# Patient Record
Sex: Female | Born: 1945 | Race: White | Marital: Married | Smoking: Never smoker
Health system: Southern US, Community
[De-identification: ages and names within clinical notes are randomized; demographics above are authoritative.]

## PROBLEM LIST (undated history)

## (undated) DIAGNOSIS — R739 Hyperglycemia, unspecified: Secondary | ICD-10-CM

## (undated) DIAGNOSIS — G2581 Restless legs syndrome: Secondary | ICD-10-CM

## (undated) DIAGNOSIS — F32A Depression, unspecified: Secondary | ICD-10-CM

## (undated) HISTORY — DX: Depression, unspecified: F32.A

## (undated) HISTORY — DX: Hyperglycemia, unspecified: R73.9

## (undated) HISTORY — PX: TONSILLECTOMY: SUR1361

## (undated) HISTORY — PX: BUNIONECTOMY: SHX129

## (undated) HISTORY — PX: CYSTECTOMY: SUR359

## (undated) HISTORY — PX: MASTECTOMY: SHX3

## (undated) HISTORY — DX: Restless legs syndrome: G25.81

---

## 2020-06-24 ENCOUNTER — Encounter: Payer: Self-pay | Admitting: Neurology

## 2020-08-29 NOTE — Progress Notes (Signed)
NEUROLOGY CONSULTATION NOTE  Alicia Macdonald MRN: [id number] DOB: 1946-03-25  Referring provider: Dina Rich, MD Primary care provider: Dina Rich, MD  Reason for consult:  Headache, trigeminal neuralgia  Assessment/Plan:   Left-sided trigeminal neuralgia  1.  Titrate gabapentin to 300mg  TID 2.  Check MRI of brain and left trigeminal nerve 3.  Follow up 6 months.   Subjective:  Alicia Macdonald is a 75 year old female with hypothyroidism, depression, HLD, glaucoma and RLS who presents for headache and left trigeminal neuralgia.  She is accompanied by her husband who supplements history.  About one year ago, she developed left sided ear pain.  She saw her PCP and was told it was not an ear infection.  The pain spread to include severe paroxysmal pain from the left V2 that radiates down the left side of her jaw.  She reports soreness on the left side of her head and if she touches it, it may aggravate the pain in the face.  She also reports soreness on the left side of her throat when she swallows.  No facial numbness or weakness.  She has a known parotid gland nodule that is being monitored by ENT.  She was evaluated by her dentist who provided her a bite guard which has been ineffective.    In November 2021, she slipped and fell striking the left occipital region, sustaining a laceration.  She went to the ED where she received staples.  CT head was negative.  CT cervical spine showed mild DDD with facet arthropathy most pronounced at C5-6.  Current medications:  Gabapentin 300mg  QHS (for RLS), duloxetine 60mg  BID (depression)  PAST MEDICAL HISTORY: Past Medical History:  Diagnosis Date  . Depression   . Hyperglycemia   . RLS (restless legs syndrome)    PAST SURGICAL HISTORY: Past Surgical History:  Procedure Laterality Date  . BUNIONECTOMY    . CESAREAN SECTION  1990  . CYSTECTOMY    . MASTECTOMY    . TONSILLECTOMY     MEDICATIONS: Current Outpatient Medications  on File Prior to Visit  Medication Sig Dispense Refill  . levothyroxine (EUTHYROX) 125 MCG tablet Take 125 mcg by mouth daily.    07-19-1968 atorvastatin (LIPITOR) 40 MG tablet Take 1 tablet by mouth daily.    Sodium (DSS) 100 MG CAPS Take by mouth.    . DULoxetine (CYMBALTA) 60 MG capsule Take 60 mg by mouth every 12 (twelve) hours.    . gabapentin (NEURONTIN) 300 MG capsule Take 1 capsule by mouth at bedtime.    . magnesium (MAGTAB) 84 MG ( ) TBCR SR tablet Take by mouth.    Marland Kitchen omeprazole (PRILOSEC) 40 MG capsule Take 1 capsule by mouth daily.     No current facility-administered medications on file prior to visit.     ALLERGIES: Not on File  FAMILY HISTORY: No family history on file.  Objective:  Blood pressure 135/85, pulse 68, height 5\' 6"  (1.676 m), weight 226 lb 9.6 oz (102.8 kg), SpO2 95 %. General: No acute distress.  Patient appears well-groomed.   Head:  Normocephalic/atraumatic Eyes:  fundi examined but not visualized Neck: supple, no paraspinal tenderness, full range of motion Back: No paraspinal tenderness Heart: regular rate and rhythm Lungs: Clear to auscultation bilaterally. Vascular: No carotid bruits. Neurological Exam: Mental status: alert and oriented to person, place, and time, recent and remote memory intact, fund of knowledge intact, attention and concentration intact, speech fluent and not dysarthric, language intact. Cranial nerves:  CN I: not tested CN II: pupils equal, round and reactive to light, visual fields intact CN III, IV, VI:  full range of motion, no nystagmus, no ptosis CN V: facial sensation intact. CN VII: upper and lower face symmetric CN VIII: hearing intact CN IX, X: gag intact, uvula midline CN XI: sternocleidomastoid and trapezius muscles intact CN XII: tongue midline Bulk & Tone: normal, no fasciculations. Motor:  muscle strength 5/5 throughout Sensation:  Pinprick, temperature and vibratory sensation intact. Deep Tendon  Reflexes:  2+ throughout,  toes downgoing.   Finger to nose testing:  Without dysmetria.   Heel to shin:  Without dysmetria.   Gait:  Normal station and stride.  Romberg negative.    Thank you for allowing me to take part in the care of this patient.  Shon Millet, DO  CC: Dina Rich, MD

## 2020-08-30 ENCOUNTER — Encounter: Payer: Self-pay | Admitting: Neurology

## 2020-08-30 ENCOUNTER — Other Ambulatory Visit: Payer: Self-pay

## 2020-08-30 ENCOUNTER — Ambulatory Visit (INDEPENDENT_AMBULATORY_CARE_PROVIDER_SITE_OTHER): Payer: Medicare Other | Admitting: Neurology

## 2020-08-30 VITALS — BP 135/85 | HR 68 | Ht 66.0 in | Wt 226.6 lb

## 2020-08-30 DIAGNOSIS — G500 Trigeminal neuralgia: Secondary | ICD-10-CM | POA: Diagnosis not present

## 2020-08-30 DIAGNOSIS — R6884 Jaw pain: Secondary | ICD-10-CM

## 2020-08-30 MED ORDER — GABAPENTIN 100 MG PO CAPS: ORAL_CAPSULE | ORAL | 0 refills | Status: AC

## 2020-08-30 NOTE — Patient Instructions (Signed)
1.  Continue gabapentin 300mg  at bedtime 2.  Start gabapentin 100mg  as directed - titrate up to goal of 300mg  three times daily - if no improvement after being on 300mg  three times daily for 2 weeks, then contact me. 3.  MRI of brain and left trigeminal nerve with and without contrast 4.  Follow up 4 to 6 months

## 2020-09-02 ENCOUNTER — Telehealth: Payer: Self-pay | Admitting: Neurology

## 2020-09-02 NOTE — Progress Notes (Signed)
  Patient ID: [id number] Time: Time: 09/02/2020 9:58 AM Patient Name: Alicia Macdonald, Alicia Macdonald Patient DOB: 09-11-1945 Physician Name: Everlena Cooper ADAM Physician Address: [address] [place], [place] [address] Physician Tax YQ:[id number] CPT Code: [phone] Case Number: [id number]  Member is Medicare primary and does not require a notification at this time.

## 2020-09-02 NOTE — Progress Notes (Signed)
  Patient ID: [id number] Time: Time: 09/02/2020 10:03 AM Patient Name: Alicia Macdonald, Alicia Macdonald Patient DOB: Oct 09, 1945 Physician Name: Everlena Cooper ADAM Physician Address: [address] [place], [place] [address] Physician Tax GX:[id number] CPT Code: [phone] Case Number: [id number]  Member is Medicare primary and does not require a notification at this time.

## 2020-09-02 NOTE — Telephone Encounter (Signed)
Patient called and said she forgot to tell Dr. Everlena Cooper about one of her surgeries: bilateral cataract surgery with a stint in each eye.  FYI only

## 2020-09-06 ENCOUNTER — Other Ambulatory Visit: Payer: Medicare Other

## 2020-09-09 ENCOUNTER — Other Ambulatory Visit: Payer: Self-pay

## 2020-09-09 ENCOUNTER — Telehealth: Payer: Self-pay | Admitting: Neurology

## 2020-09-09 DIAGNOSIS — R6884 Jaw pain: Secondary | ICD-10-CM

## 2020-09-09 DIAGNOSIS — G500 Trigeminal neuralgia: Secondary | ICD-10-CM

## 2020-09-09 NOTE — Telephone Encounter (Signed)
Had to reorder mri, await for prior authorization status.

## 2020-09-09 NOTE — Telephone Encounter (Signed)
Patient husband states that the first Berkley Harvey ran out and they called medicare to see if they could get a new auth.  Please call and let the pt know of the status of the auth   Pt phone number is [phone]

## 2020-09-09 NOTE — Telephone Encounter (Signed)
Pt husband states that [facility] imaging states that the MRI needs prior auth and has not heard anything from our office.

## 2020-09-10 ENCOUNTER — Ambulatory Visit
Admission: RE | Admit: 2020-09-10 | Discharge: 2020-09-10 | Disposition: A | Discharge status: Home / Self Care | Payer: Medicare Other | Source: Ambulatory Visit | Attending: Neurology | Admitting: Neurology

## 2020-09-10 DIAGNOSIS — G500 Trigeminal neuralgia: Secondary | ICD-10-CM

## 2020-09-10 DIAGNOSIS — R6884 Jaw pain: Secondary | ICD-10-CM

## 2020-09-10 MED ORDER — GADOBENATE DIMEGLUMINE 529 MG/ML IV SOLN
20.0000 mL | Freq: Once | INTRAVENOUS | Status: AC | PRN
  Administered 2020-09-10: 20 mL via INTRAVENOUS

## 2020-09-13 NOTE — Progress Notes (Signed)
Pt advised.She will call and schedule a visit with ENT.

## 2020-09-13 NOTE — Telephone Encounter (Signed)
Pt schedule a visit with her ENT on 09/23/20 will send a records request to pt so she can sign and send back to have records sent over to the ENT office.

## 2020-09-13 NOTE — Telephone Encounter (Signed)
Patient called and left a message requesting a call back about this. 

## 2020-09-23 ENCOUNTER — Telehealth: Payer: Self-pay | Admitting: Neurology

## 2020-09-23 NOTE — Telephone Encounter (Signed)
Pt called in and needs a rtn call regarding results for her MRI.

## 2020-09-23 NOTE — Telephone Encounter (Signed)
No answer at 1154 09/23/2020

## 2020-09-23 NOTE — Telephone Encounter (Signed)
Patient got what she needed she said and said thank you for calling.

## 2020-10-11 ENCOUNTER — Telehealth: Payer: Self-pay

## 2020-10-11 MED ORDER — GABAPENTIN 300 MG PO CAPS: 1.0000 | ORAL_CAPSULE | Freq: Three times a day (TID) | ORAL | 2 refills | Status: DC

## 2020-10-11 NOTE — Telephone Encounter (Signed)
Refilll request rescived from [facility]: Messge pt needs refills of Gabapentin 100 mg

## 2020-10-11 NOTE — Addendum Note (Signed)
Addended by: Leida Lauth on: 10/11/2020 03:11 PM   Modules accepted: Orders

## 2020-12-20 ENCOUNTER — Other Ambulatory Visit: Payer: Self-pay | Admitting: Hematology and Oncology

## 2020-12-20 ENCOUNTER — Inpatient Hospital Stay: Payer: Medicare Other | Attending: Hematology and Oncology

## 2020-12-20 DIAGNOSIS — R300 Dysuria: Secondary | ICD-10-CM

## 2021-03-03 ENCOUNTER — Ambulatory Visit: Payer: Medicare Other | Admitting: Neurology

## 2021-05-30 ENCOUNTER — Other Ambulatory Visit: Payer: Self-pay | Admitting: Neurology

## 2021-05-31 ENCOUNTER — Other Ambulatory Visit: Payer: Self-pay

## 2021-05-31 MED ORDER — GABAPENTIN 300 MG PO CAPS: 300.0000 mg | ORAL_CAPSULE | Freq: Three times a day (TID) | ORAL | 2 refills | Status: AC

## 2021-09-08 IMAGING — MR MR HEAD WO/W CM
12 series · 48 of 48 positions shown · IV contrast (multihance)
Comparison: Head CT 03 13 20

CLINICAL DATA: Trigeminal neuralgia on the left. History of breast
cancer

EXAM:
MRI HEAD WITHOUT AND WITH CONTRAST
MRI FACE WITH AND WITHOUT CONTRAST
TECHNIQUE: Multiplanar, multiecho pulse sequences of the brain and and face
were obtained without and with intravenous contrast.
CONTRAST:  20mL MULTIHANCE GADOBENATE DIMEGLUMINE 529 MG/ML IV SOLN

[Series 2: T1 · sagittal · 5.0mm · 0.45mm/px · 1 of 25 slices shown]
[im 1/25]
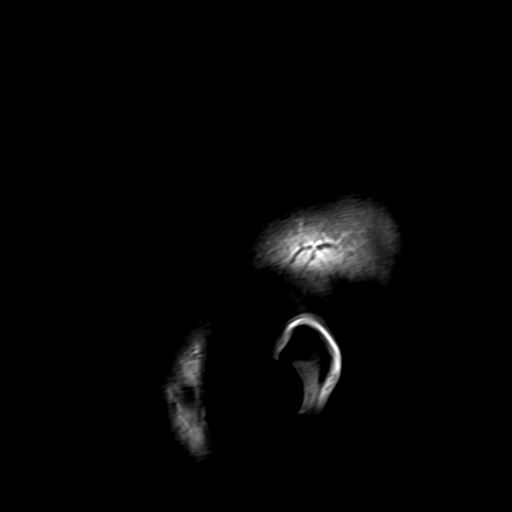

[Series 3: ax ep2d_diff_3 · axial · 3.0mm · 1.80mm/px · z∈[+13,+163]mm · 6 of 104 slices shown]
[im 1/104]
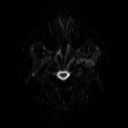
[im 21/104]
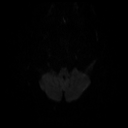
[im 42/104]
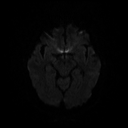
[im 62/104]
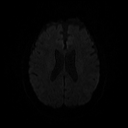
[im 83/104]
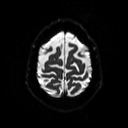
[im 104/104]
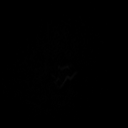

[Series 4: ax ep2d_diff_3_adc · axial · 3.0mm · 1.80mm/px · z∈[+13,+163]mm · 3 of 55 slices shown]
[im 1/55]
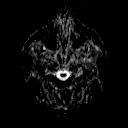
[im 28/55]
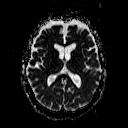
[im 55/55]
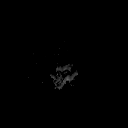

[Series 5: cor ep2d_diff · coronal · 5.0mm · 1.77mm/px · 3 of 59 slices shown]
[im 1/59]
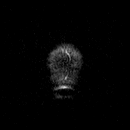
[im 30/59]
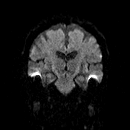
[im 59/59]
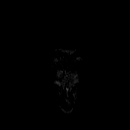

[Series 6: cor ep2d_diff_adc · coronal · 5.0mm · 1.77mm/px · 2 of 30 slices shown]
[im 1/30]
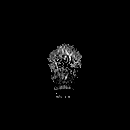
[im 30/30]
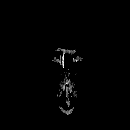

[Series 8: swi_images · axial · 2.0mm · 0.98mm/px · z∈[+17,+164]mm · 5 of 80 slices shown]
[im 1/80]
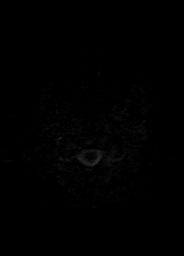
[im 20/80]
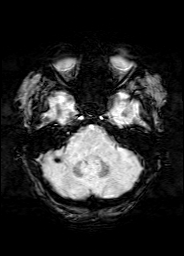
[im 40/80]
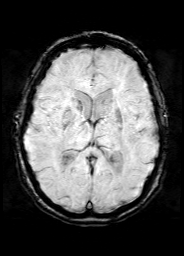
[im 60/80]
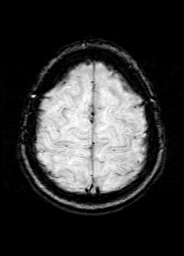
[im 80/80]
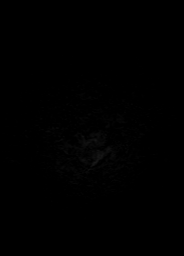

[Series 9: FLAIR · axial · 3.0mm · 0.43mm/px · z∈[+9,+161]mm · 2 of 43 slices shown]
[im 1/43]
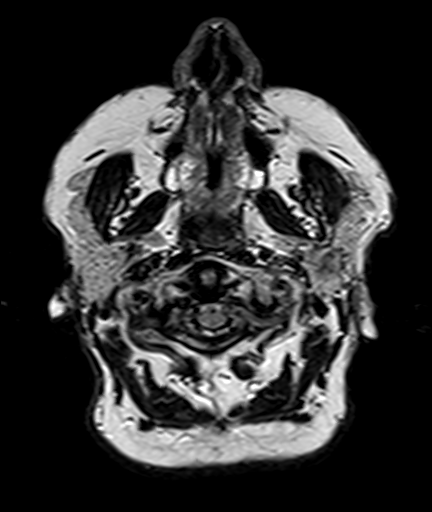
[im 43/43]
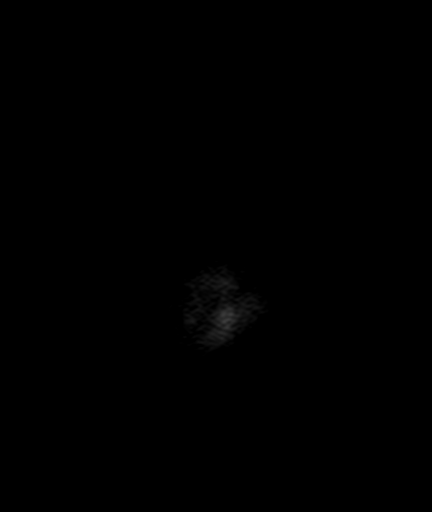

[Series 10: T2 · axial · 5.0mm · 0.65mm/px · z∈[+13,+168]mm · 2 of 29 slices shown (1 of 2)]
[im 1/29]
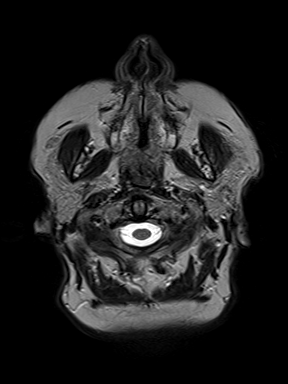
[im 29/29]
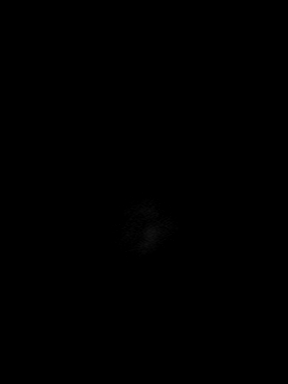

[Series 11: t1_mpr_tra · axial · 1.0mm · 0.72mm/px · z∈[+5,+168]mm · 10 of 176 slices shown]
[im 1/176]
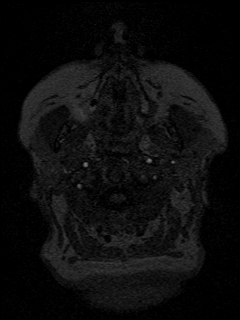
[im 20/176]
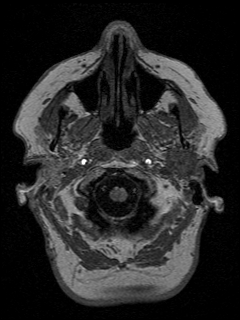
[im 39/176]
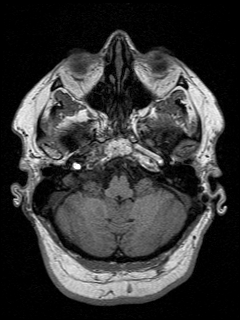
[im 59/176]
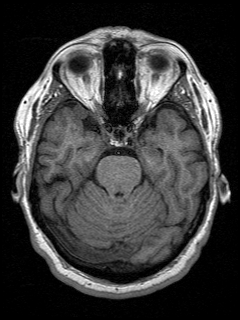
[im 78/176]
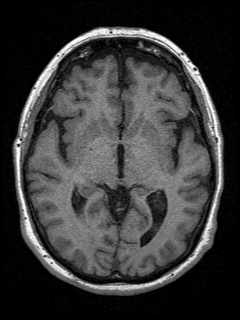
[im 98/176]
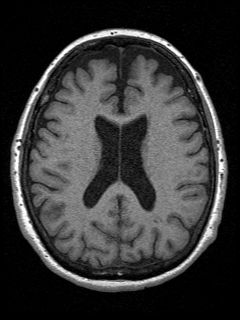
[im 117/176]
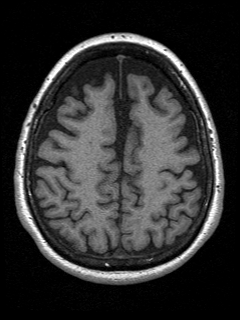
[im 137/176]
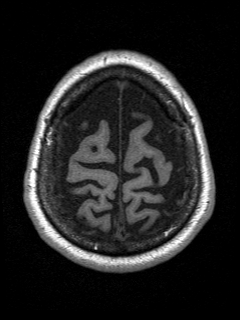
[im 156/176]
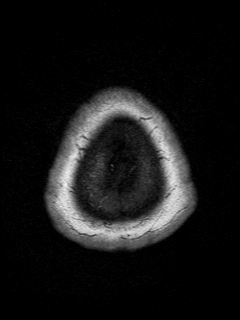
[im 176/176]
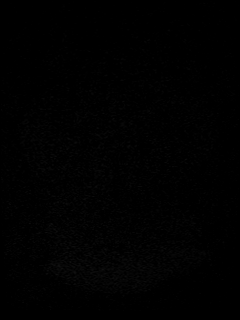

[Series 12: T2 · coronal · 5.0mm · 0.45mm/px · 2 of 31 slices shown (2 of 2)]
[im 1/31]
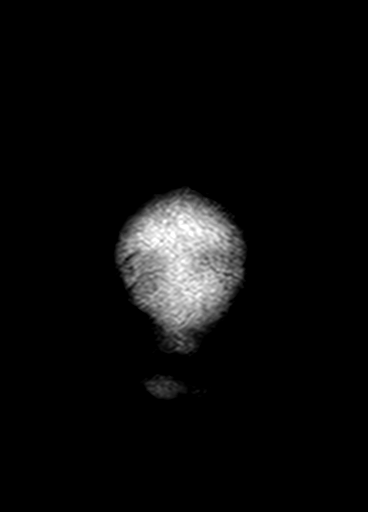
[im 31/31]
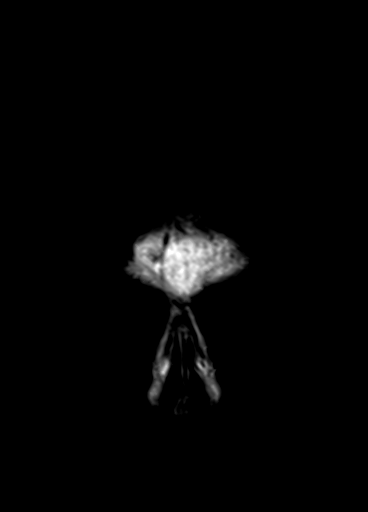

[Series 13: T1 post-contrast · coronal · 5.0mm · 0.72mm/px · 2 of 32 slices shown]
[im 1/32]
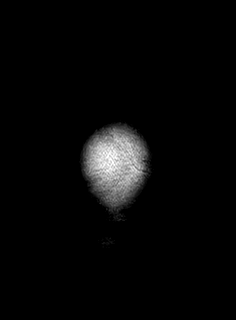
[im 32/32]
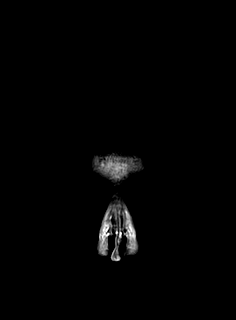

[Series 14: post t1_mpr_tra · axial · 1.0mm · 0.72mm/px · z∈[+5,+168]mm · 10 of 176 slices shown]
[im 1/176]
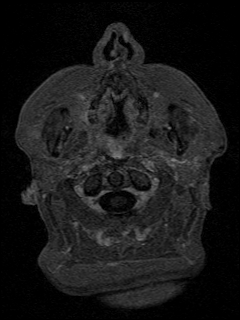
[im 20/176]
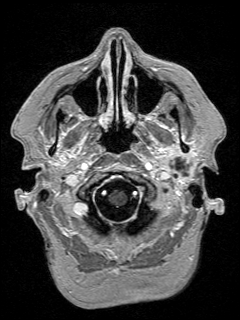
[im 39/176]
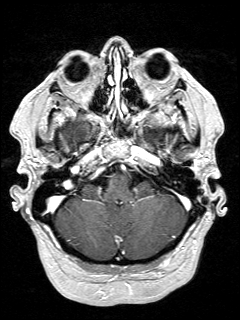
[im 59/176]
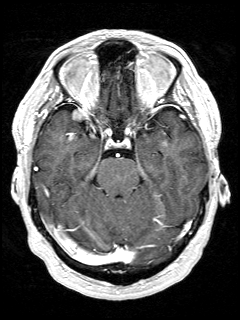
[im 78/176]
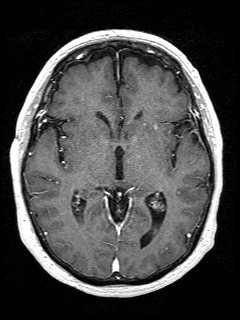
[im 98/176]
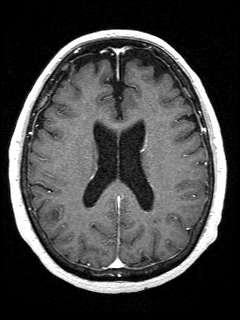
[im 117/176]
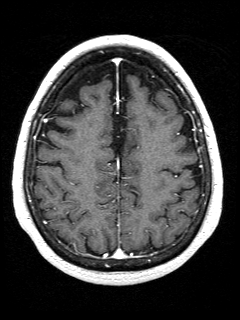
[im 137/176]
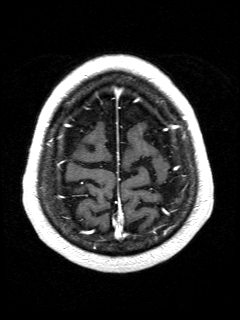
[im 156/176]
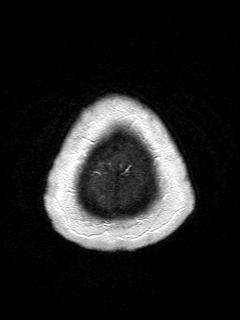
[im 176/176]
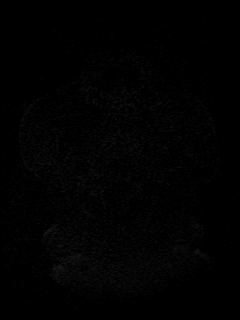

[48 of 48 positions shown; findings below may reference images not displayed]

FINDINGS: Brain:

Brain: No infarct, hemorrhage, hydrocephalus, or collection. Mild
cerebral volume loss and small vessel ischemic type change. Dural
based mass in the right middle cranial fossa with homogeneous
enhancement, in retrospect stable from prior and consistent with
meningioma. The mass measures up to 1 cm and does not cause temporal
lobe mass effect or edema. No pontine, cisternal, or Esmeralda Shekhar
abnormality to explain symptoms.

Vascular: Normal flow voids and vascular enhancement

Skull and upper cervical spine: Normal marrow signal

Sinuses/Orbits: Negative

Face:

Infiltrative mass with central non enhancement suggesting necrosis,
present in the left parotid gland and spanning the deep and
superficial lobes , measuring up to 23 mm on axial slices. There is
a more superficial nodule in the superficial left parotid which is
not contiguous or definitely related-possibly a node. Although
directly in line with the facial nerve no evidence of perineural
tumor spread into the temporal bone.

No masslike or inflammatory enhancement seen at the trigeminal
nerves. The left AICA a passes inferior to the root entry zone of
the left trigeminal nerve.

Left mastoidectomy.  No opacification.
IMPRESSION: 1. 2.3 cm infiltrative, necrotic appearing mass in the left parotid,
posterior to the mandible, appearance worrisome for aggressive
tumor. There has been a left-sided mastoidectomy, is there are
available preoperative imaging or known parotid surgery?
2. No mass or inflammation seen along the left trigeminal nerve. The
left AICA passes inferior to the left root entry zone.
3. 1 cm meningioma in the right middle cranial fossa.

## 2022-01-09 ENCOUNTER — Telehealth: Payer: Self-pay | Admitting: Neurology

## 2022-01-09 NOTE — Telephone Encounter (Signed)
Pt called and informed her of Dr. Tomi Likens answer. She was not happy with the physician that treated her. The surgeon was find but he is in the same Hospital and does not want to run into her. I told her to call surgeons office and see if they will refer someone out of there network. And if no luck to call back.

## 2022-01-09 NOTE — Telephone Encounter (Signed)
The following message was left with [facility] on 01/07/22 at 2:07 PM.  Caller says that she had a needle biopsy and they found cancer on her neck. She had carotid surgery and they removed the nerve as well. She needs a referral to a surgeon for her face.

## 2022-02-14 ENCOUNTER — Other Ambulatory Visit: Payer: Self-pay | Admitting: Neurology

## 2022-02-21 ENCOUNTER — Other Ambulatory Visit: Payer: Self-pay | Admitting: Neurology

## 2022-02-22 ENCOUNTER — Other Ambulatory Visit: Payer: Self-pay | Admitting: Neurology

## 2023-04-25 HISTORY — PX: LIP REPAIR: SHX440

## 2024-02-20 ENCOUNTER — Encounter (HOSPITAL_BASED_OUTPATIENT_CLINIC_OR_DEPARTMENT_OTHER): Payer: Self-pay

## 2024-02-20 ENCOUNTER — Ambulatory Visit (HOSPITAL_BASED_OUTPATIENT_CLINIC_OR_DEPARTMENT_OTHER)
Admission: EM | Admit: 2024-02-20 | Discharge: 2024-02-20 | Disposition: A | Discharge status: Home / Self Care | Attending: Family Medicine | Admitting: Family Medicine

## 2024-02-20 DIAGNOSIS — R0789 Other chest pain: Secondary | ICD-10-CM | POA: Diagnosis not present

## 2024-02-20 NOTE — ED Provider Notes (Signed)
 Alicia Macdonald CARE    CSN: [id number] Arrival date & time: 02/20/24  1459      History   Chief Complaint Chief Complaint  Patient presents with   Chest Pain    HPI Alicia Macdonald is a 78 y.o. female.   Patient is a 78 year old female who presents today with aching chest pain that is located right and left lower chest around 1430. Denies SOB. The pain was radiated up to her jaw but the jaw pain has since resolved. She is visibly clammy.  Currently the chest pain has resolved.  Patient is pointing to her epigastric area where she describes where the pain is starting.  Felt like a wave up to her face.  Was not doing anything prior to this starting.  Was just getting in the car.  Has been suffering from some mild constipation.  Does have a history of GERD.  No cough or fevers    Chest Pain   Past Medical History:  Diagnosis Date   Depression    Hyperglycemia    RLS (restless legs syndrome)     There are no active problems to display for this patient.   Past Surgical History:  Procedure Laterality Date   BUNIONECTOMY     CESAREAN SECTION  1990   CYSTECTOMY     LIP REPAIR  2025   MASTECTOMY     TONSILLECTOMY      OB History   No obstetric history on file.      Home Medications    Prior to Admission medications   Medication Sig Start Date End Date Taking? Authorizing Provider  atorvastatin (LIPITOR) 40 MG tablet Take 1 tablet by mouth daily. 06/28/20   [provider]  Docusate Sodium (DSS) 100 MG CAPS Take by mouth.    [provider]  DULoxetine (CYMBALTA) 60 MG capsule Take 60 mg by mouth every 12 (twelve) hours. 08/26/20   [provider]  gabapentin  (NEURONTIN ) 100 MG capsule Take 1 capsule in morning for one week, the 1 capsule in morning and 1 capsule in afternoon for one week, then 2 capsules in morning and 2 capsules in afternoon for one week, then 3 capsules in morning and 3 capsules in afternoon 08/30/20   Skeet Juliene SAUNDERS, DO   gabapentin  (NEURONTIN ) 300 MG capsule Take 1 capsule (300 mg total) by mouth 3 (three) times daily. 05/31/21   Skeet Juliene SAUNDERS, DO  levothyroxine (EUTHYROX) 125 MCG tablet Take 125 mcg by mouth daily. 12/10/19   [provider]  magnesium (MAGTAB) 84 MG ( ) TBCR SR tablet Take by mouth.    [provider]  omeprazole (PRILOSEC) 40 MG capsule Take 1 capsule by mouth daily. 08/12/20   [provider]    Family History Family History  Problem Relation Age of Onset   Migraines Sister     Social History Social History   Tobacco Use   Smoking status: Never   Smokeless tobacco: Never  Vaping Use   Vaping status: Never Used  Substance Use Topics   Alcohol use: Never   Drug use: Never     Allergies   Cephalexin, Iodine, and Sulfamethoxazole-trimethoprim   Review of Systems Review of Systems  Cardiovascular:  Positive for chest pain.     Physical Exam Triage Vital Signs ED Triage Vitals  Encounter Vitals Group     BP 02/20/24 1512 121/82     Girls Systolic BP Percentile --      Girls Diastolic BP Percentile --  Boys Systolic BP Percentile --      Boys Diastolic BP Percentile --      Pulse Rate 02/20/24 1512 74     Resp 02/20/24 1512 20     Temp 02/20/24 1512 98.3 F (36.8 C)     Temp Source 02/20/24 1512 Oral     SpO2 02/20/24 1512 96 %     Weight --      Height --      Head Circumference --      Peak Flow --      Pain Score 02/20/24 1509 8     Pain Loc --      Pain Education --      Exclude from Growth Chart --    No data found.  Updated Vital Signs BP 121/82 (BP Location: Right Arm)   Pulse 74   Temp 98.3 F (36.8 C) (Oral)   Resp 20   SpO2 96%   Visual Acuity Right Eye Distance:   Left Eye Distance:   Bilateral Distance:    Right Eye Near:   Left Eye Near:    Bilateral Near:     Physical Exam Vitals and nursing note reviewed.  Constitutional:      General: She is not in acute distress.    Appearance: Normal  appearance. She is not ill-appearing, toxic-appearing or diaphoretic.  Cardiovascular:     Rate and Rhythm: Normal rate and regular rhythm.  Pulmonary:     Effort: Pulmonary effort is normal.     Breath sounds: Normal breath sounds.  Abdominal:     General: Abdomen is flat. Bowel sounds are normal.     Palpations: Abdomen is soft.     Tenderness: There is no abdominal tenderness.      Comments: Pt pressing on upper abdomen when describing pain.   Neurological:     Mental Status: She is alert.  Psychiatric:        Mood and Affect: Mood normal.      UC Treatments / Results  Labs (all labs ordered are listed, but only abnormal results are displayed) Labs Reviewed - No data to display  EKG   Radiology No results found.  Procedures Procedures (including critical care time)  Medications Ordered in UC Medications - No data to display  Initial Impression / Assessment and Plan / UC Course  I have reviewed the triage vital signs and the nursing notes.  Pertinent labs & imaging results that were available during my care of the patient were reviewed by me and considered in my medical decision making (see chart for details).     Chest pain-EKG done today with no concerns.  EKG with normal sinus rhythm and left axis deviation.  There is no acute ST changes. Believe this may be more GI related.  She may have some trapped gas or GERD related symptoms.  Her symptoms currently have  completely resolved.  She is not having any current chest pain or jaw pain. Feel safe to send her home at this time and that if symptoms come back she will need to go straight to the ER for further workup.  Patient understanding and agree. Final Clinical Impressions(s) / UC Diagnoses   Final diagnoses:  Other chest pain     Discharge Instructions      I did not see any specific concerns on your EKG.  Sounds like your symptoms are resolving.  This may be more GI related like trapped gas.  If your  symptoms returned I will advise you to go straight to the ER.  Otherwise follow-up with your primary care doctor    ED Prescriptions   None    PDMP not reviewed this encounter.   Adah Wilbert LABOR, FNP 02/20/24 1644

## 2024-02-20 NOTE — ED Triage Notes (Signed)
 Pt states she began to have aching chest pain that is located right and left lower chest around 1430. Denies SOBr. The pain was radiated up to her jaw but the jaw pain has since resolved. She is visibly clammy

## 2024-02-20 NOTE — Discharge Instructions (Addendum)
 I did not see any specific concerns on your EKG.  Sounds like your symptoms are resolving.  This may be more GI related like trapped gas.  If your symptoms returned I will advise you to go straight to the ER.  Otherwise follow-up with your primary care doctor
